# Patient Record
Sex: Male | Born: 2000 | Race: Black or African American | Hispanic: No | Marital: Single | Smoking: Current some day smoker
Health system: Southern US, Community
[De-identification: ages and names within clinical notes are randomized; demographics above are authoritative.]

---
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
## 2021-05-11 ENCOUNTER — Encounter (HOSPITAL_COMMUNITY): Payer: Self-pay | Admitting: Emergency Medicine

## 2021-05-11 ENCOUNTER — Emergency Department (HOSPITAL_COMMUNITY)
Admission: EM | Admit: 2021-05-11 | Discharge: 2021-05-12 | Disposition: A | Discharge status: Home / Self Care | Payer: Medicaid Other | Attending: Emergency Medicine | Admitting: Emergency Medicine

## 2021-05-11 ENCOUNTER — Other Ambulatory Visit: Payer: Self-pay

## 2021-05-11 DIAGNOSIS — Y92410 Unspecified street and highway as the place of occurrence of the external cause: Secondary | ICD-10-CM | POA: Diagnosis not present

## 2021-05-11 DIAGNOSIS — S161XXA Strain of muscle, fascia and tendon at neck level, initial encounter: Secondary | ICD-10-CM | POA: Insufficient documentation

## 2021-05-11 DIAGNOSIS — S299XXA Unspecified injury of thorax, initial encounter: Secondary | ICD-10-CM | POA: Insufficient documentation

## 2021-05-11 DIAGNOSIS — S199XXA Unspecified injury of neck, initial encounter: Secondary | ICD-10-CM | POA: Diagnosis present
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
## 2021-05-11 NOTE — ED Notes (Signed)
Pt is with child in peds ?

## 2021-05-12 ENCOUNTER — Emergency Department (HOSPITAL_COMMUNITY): Payer: Medicaid Other

## 2021-05-12 MED ORDER — TIZANIDINE HCL 4 MG PO TABS: 4.0000 mg | ORAL_TABLET | Freq: Four times a day (QID) | ORAL | 0 refills | Status: DC | PRN

## 2021-05-12 MED ORDER — IBUPROFEN 800 MG PO TABS
800.0000 mg | ORAL_TABLET | Freq: Once | ORAL | Status: AC
  Administered 2021-05-12: 800 mg via ORAL
  Filled 2021-05-12: qty 1
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
## 2021-05-12 NOTE — ED Notes (Signed)
Discharge instructions reviewed with patient. Patient verbalized understanding of instructions. Follow-up care and medications were reviewed. Patient ambulatory with steady gait. VSS upon discharge.  ?
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
## 2022-12-09 ENCOUNTER — Other Ambulatory Visit: Payer: Self-pay

## 2022-12-09 ENCOUNTER — Emergency Department (HOSPITAL_COMMUNITY)
Admission: EM | Admit: 2022-12-09 | Discharge: 2022-12-10 | Disposition: A | Discharge status: Home / Self Care | Payer: Worker's Compensation | Attending: Emergency Medicine | Admitting: Emergency Medicine

## 2022-12-09 DIAGNOSIS — Z23 Encounter for immunization: Secondary | ICD-10-CM | POA: Diagnosis not present

## 2022-12-09 DIAGNOSIS — Y990 Civilian activity done for income or pay: Secondary | ICD-10-CM | POA: Insufficient documentation

## 2022-12-09 DIAGNOSIS — T148XXA Other injury of unspecified body region, initial encounter: Secondary | ICD-10-CM

## 2022-12-09 DIAGNOSIS — S91232A Puncture wound without foreign body of left great toe with damage to nail, initial encounter: Secondary | ICD-10-CM | POA: Insufficient documentation

## 2022-12-09 DIAGNOSIS — S99922A Unspecified injury of left foot, initial encounter: Secondary | ICD-10-CM | POA: Diagnosis present

## 2022-12-09 NOTE — ED Triage Notes (Signed)
Pt to ED POV from work. 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 Pt states he was wearing his socks and shoes but nail went through both to his left foot. Pt states he is unsure of how deep the nail went into his foot, but did break the skin. Pt states he went to the bathroom at work and removed the nail himself, poured rubbing alcohol on foot, and then wrapped it with a bandage. Pt able to move foot / toes. No bleeding. Pt unsure of when his last tetanus shot was. Pt ambulatory.

## 2022-12-10 ENCOUNTER — Emergency Department (HOSPITAL_COMMUNITY): Payer: No Typology Code available for payment source

## 2022-12-10 MED ORDER — TETANUS-DIPHTH-ACELL PERTUSSIS 5-2.5-18.5 LF-MCG/0.5 IM SUSY
0.5000 mL | PREFILLED_SYRINGE | Freq: Once | INTRAMUSCULAR | Status: AC
  Administered 2022-12-10: 0.5 mL via INTRAMUSCULAR
  Filled 2022-12-10: qty 0.5

## 2022-12-10 MED ORDER — CIPROFLOXACIN HCL 500 MG PO TABS: 500.0000 mg | ORAL_TABLET | Freq: Two times a day (BID) | ORAL | 0 refills | Status: DC

## 2022-12-10 MED ORDER — CIPROFLOXACIN HCL 500 MG PO TABS
500.0000 mg | ORAL_TABLET | Freq: Once | ORAL | Status: AC
  Administered 2022-12-10: 500 mg via ORAL
  Filled 2022-12-10: qty 1

## 2022-12-10 NOTE — ED Provider Notes (Signed)
[place] EMERGENCY DEPARTMENT AT [facility] Provider Note   CSN: [id number] Arrival date & time: 12/09/22  1707     History  Chief Complaint  Patient presents with   Stepped on Nail    Brian Fox is a 22 y.o. male.  The history is provided by the patient and medical records.   22 year old male presenting to the ED for wound of left great toe.  He was at work and stepped on a rusty nail on the floor.  Nail went through his work boot, sock, and into the base of the left great toe.  He remove this on his own and did have some bleeding afterwards.  He is unsure of his last tetanus.  He denies any history of diabetes.  He did clean the area with alcohol and wrapped foot prior to arrival.  Home Medications Prior to Admission medications   Medication Sig Start Date End Date Taking? Authorizing Provider  ciprofloxacin (CIPRO) 500 MG tablet Take 1 tablet (500 mg total) by mouth every 12 (twelve) hours. 12/10/22  Yes Garlon Hatchet, PA-C  ibuprofen (ADVIL) 200 MG tablet Take 200 mg by mouth every 6 (six) hours as needed for headache or moderate pain.    [provider]  tiZANidine (ZANAFLEX) 4 MG tablet Take 1 tablet (4 mg total) by mouth every 6 (six) hours as needed for muscle spasms. 05/12/21   Tilden Fossa, MD      Allergies    Patient has no known allergies.    Review of Systems   Review of Systems  Skin:  Positive for wound.  All other systems reviewed and are negative.   Physical Exam Updated Vital Signs BP 124/80 (BP Location: Right Arm)   Pulse 60   Temp 98.2 F (36.8 C) (Oral)   Resp 16   SpO2 100%   Physical Exam Vitals and nursing note reviewed.  Constitutional:      Appearance: He is well-developed.  HENT:     Head: Normocephalic and atraumatic.  Eyes:     Conjunctiva/sclera: Conjunctivae normal.     Pupils: Pupils are equal, round, and reactive to light.  Cardiovascular:     Rate and Rhythm: Normal rate and regular rhythm.      Heart sounds: Normal heart sounds.  Pulmonary:     Effort: Pulmonary effort is normal.     Breath sounds: Normal breath sounds.  Abdominal:     General: Bowel sounds are normal.     Palpations: Abdomen is soft.  Musculoskeletal:        General: Normal range of motion.     Cervical back: Normal range of motion.     Comments: Puncture wound at base of left great toe, there is no active bleeding or drainage present, foot is dirty in appearance, multiple calluses noted  Skin:    General: Skin is warm and dry.  Neurological:     Mental Status: He is alert and oriented to person, place, and time.     ED Results / Procedures / Treatments   Labs (all labs ordered are listed, but only abnormal results are displayed) Labs Reviewed - No data to display  EKG None  Radiology DG Toe Great Left  Result Date: 12/10/2022 CLINICAL DATA:  Stepped on nail EXAM: LEFT GREAT TOE COMPARISON:  None Available. FINDINGS: There is no evidence of fracture or dislocation. There is no evidence of arthropathy or other focal bone abnormality. Soft tissues are unremarkable. IMPRESSION: Negative. Electronically  Signed   By: Jasmine Pang M.D.   On: 12/10/2022 02:32    Procedures Procedures    Medications Ordered in ED Medications  Tdap (BOOSTRIX) injection 0.5 mL (0.5 mLs Intramuscular Given 12/10/22 0127)  ciprofloxacin (CIPRO) tablet 500 mg (500 mg Oral Given 12/10/22 0127)    ED Course/ Medical Decision Making/ A&P                                 Medical Decision Making Amount and/or Complexity of Data Reviewed Radiology: ordered and independent interpretation performed.  Risk Prescription drug management.   22 y.o. M here with puncture wound to left great toe from rusty nail at work-- went through shoe, sock, and embedded in skin.  He removed this and cleansed area with alcohol PTA.  Small puncture wound at base of toe on my exam without bleeding or drainage.  No surrounding  erythema/induration on exam.  He is not diabetic.  Tetanus updated here.  X-ray without retrained FB.  Given abx for coverage of potential Pseudomonas given shoe involvement.  Encouraged to cleanse and dress daily.  Can follow-up with PCP.  Return here for any concerns.  Final Clinical Impression(s) / ED Diagnoses Final diagnoses:  Puncture wound    Rx / DC Orders ED Discharge Orders          Ordered    ciprofloxacin (CIPRO) 500 MG tablet  Every 12 hours        12/10/22 0251              Garlon Hatchet, PA-C 12/10/22 0257    Glynn Octave, MD 12/10/22 [phone]

## 2022-12-10 NOTE — ED Notes (Signed)
Pt arrived to [place] [facility] at this time A&O x4, NAD noted

## 2022-12-10 NOTE — Discharge Instructions (Signed)
X-ray without retained foreign body. Take the prescribed medication as directed.  Keep wound clean/dry. Follow-up with your doctor if any ongoing issues. Return to the ED for new or worsening symptoms.

## 2023-08-10 ENCOUNTER — Encounter (HOSPITAL_COMMUNITY): Payer: Self-pay

## 2023-08-10 ENCOUNTER — Ambulatory Visit (HOSPITAL_COMMUNITY)
Admission: EM | Admit: 2023-08-10 | Discharge: 2023-08-10 | Disposition: A | Discharge status: Home / Self Care | Payer: Self-pay | Attending: Internal Medicine | Admitting: Internal Medicine

## 2023-08-10 ENCOUNTER — Ambulatory Visit (INDEPENDENT_AMBULATORY_CARE_PROVIDER_SITE_OTHER): Payer: Self-pay

## 2023-08-10 DIAGNOSIS — Y9367 Activity, basketball: Secondary | ICD-10-CM

## 2023-08-10 DIAGNOSIS — S62354A Nondisplaced fracture of shaft of fourth metacarpal bone, right hand, initial encounter for closed fracture: Secondary | ICD-10-CM

## 2023-08-10 DIAGNOSIS — M79644 Pain in right finger(s): Secondary | ICD-10-CM

## 2023-08-10 MED ORDER — NAPROXEN 500 MG PO TABS: 500.0000 mg | ORAL_TABLET | Freq: Two times a day (BID) | ORAL | 0 refills | Status: DC

## 2023-08-10 NOTE — ED Provider Notes (Signed)
 [facility]    CSN: [id number] Arrival date & time: 08/10/23  1710      History   Chief Complaint Chief Complaint  Patient presents with   Finger Injury    HPI Brian Fox is a 23 y.o. male.   Brian Fox is a 23 y.o. male presenting for chief complaint of pain and swelling of the right hand that happened as a result of basketball injury 2 days ago.  Patient was catching the basketball when another player hit the basketball causing the right ring finger to hyper extend.  Patient states the finger was stuck in a hyperextended position after injury and he had to put the finger back into place with his other hand. When he returned the finger back to normal positioning, he felt a crack/pop to the right hand.  Denies previous injury to the right hand, numbness and tingling distally to injury, and laceration/abrasion.  He has taken ibuprofen  at home for the pain and swelling with some relief.     History reviewed. No pertinent past medical history.  There are no active problems to display for this patient.   History reviewed. No pertinent surgical history.     Home Medications    Prior to Admission medications   Medication Sig Start Date End Date Taking? Authorizing Provider  naproxen (NAPROSYN) 500 MG tablet Take 1 tablet (500 mg total) by mouth 2 (two) times daily. 08/10/23  Yes Enedelia Dorna HERO, FNP  ibuprofen  (ADVIL ) 200 MG tablet Take 200 mg by mouth every 6 (six) hours as needed for headache or moderate pain.    [provider]  tiZANidine  (ZANAFLEX ) 4 MG tablet Take 1 tablet (4 mg total) by mouth every 6 (six) hours as needed for muscle spasms. Patient not taking: Reported on 08/10/2023 05/12/21   Griselda Norris, MD    Family History History reviewed. No pertinent family history.  Social History Social History   Tobacco Use   Smoking status: Some Days    Types: Cigars   Smokeless tobacco: Never  Vaping Use   Vaping status: Never  Used  Substance Use Topics   Alcohol use: No   Drug use: No     Allergies   Patient has no known allergies.   Review of Systems Review of Systems Per HPI  Physical Exam Triage Vital Signs ED Triage Vitals  Encounter Vitals Group     BP 08/10/23 1800 129/66     Girls Systolic BP Percentile --      Girls Diastolic BP Percentile --      Boys Systolic BP Percentile --      Boys Diastolic BP Percentile --      Pulse Rate 08/10/23 1800 78     Resp 08/10/23 1800 16     Temp 08/10/23 1800 98.4 F (36.9 C)     Temp Source 08/10/23 1800 Oral     SpO2 08/10/23 1800 97 %     Weight --      Height --      Head Circumference --      Peak Flow --      Pain Score 08/10/23 1801 6     Pain Loc --      Pain Education --      Exclude from Growth Chart --    No data found.  Updated Vital Signs BP 129/66 (BP Location: Left Arm)   Pulse 78   Temp 98.4 F (36.9 C) (Oral)   Resp 16  SpO2 97%   Visual Acuity Right Eye Distance:   Left Eye Distance:   Bilateral Distance:    Right Eye Near:   Left Eye Near:    Bilateral Near:     Physical Exam Vitals and nursing note reviewed.  Constitutional:      Appearance: He is not ill-appearing or toxic-appearing.  HENT:     Head: Normocephalic and atraumatic.     Right Ear: Hearing and external ear normal.     Left Ear: Hearing and external ear normal.     Nose: Nose normal.     Mouth/Throat:     Lips: Pink.   Eyes:     General: Lids are normal. Vision grossly intact. Gaze aligned appropriately.     Extraocular Movements: Extraocular movements intact.     Conjunctiva/sclera: Conjunctivae normal.   Pulmonary:     Effort: Pulmonary effort is normal.   Musculoskeletal:     Right wrist: Normal.     Left wrist: Normal.     Right hand: Swelling and tenderness present. No deformity, lacerations or bony tenderness. Normal range of motion. Normal strength. Normal sensation. There is no disruption of two-point discrimination. Normal  capillary refill. Normal pulse.     Left hand: Normal.     Cervical back: Neck supple.     Comments: 5/5 grip strength of right hand.  +2 right radial pulse.  Less than 2 cap refill.  Sensation and strength intact to distal right hand.  Tender to palpation over the right fourth metacarpal, swelling over area of tenderness as well.   Skin:    General: Skin is warm and dry.     Capillary Refill: Capillary refill takes less than 2 seconds.     Findings: No rash.   Neurological:     General: No focal deficit present.     Mental Status: He is alert and oriented to person, place, and time. Mental status is at baseline.     Cranial Nerves: No dysarthria or facial asymmetry.   Psychiatric:        Mood and Affect: Mood normal.        Speech: Speech normal.        Behavior: Behavior normal.        Thought Content: Thought content normal.        Judgment: Judgment normal.      UC Treatments / Results  Labs (all labs ordered are listed, but only abnormal results are displayed) Labs Reviewed - No data to display  EKG   Radiology DG Hand Complete Right Result Date: 08/10/2023 CLINICAL DATA:  [facility]jury to right ring finger 2 days ago while playing basketball (hyperextended right ring finger) EXAM: RIGHT HAND - COMPLETE 3+ VIEW COMPARISON:  None Available. FINDINGS: There is oblique / spiral fracture through the diaphysis of the 4 metacarpal. The fracture is nondisplaced. No dislocation. IMPRESSION: Nondisplaced spiral type fracture of the fourth metacarpal. Electronically Signed   By: Jackquline Boxer M.D.   On: 08/10/2023 18:47    Procedures Procedures (including critical care time)  Medications Ordered in UC Medications - No data to display  Initial Impression / Assessment and Plan / UC Course  I have reviewed the triage vital signs and the nursing notes.  Pertinent labs & imaging results that were available during my care of the patient were reviewed by me and considered in my  medical decision making (see chart for details).   1.  Closed nondisplaced fracture of shaft of fourth  metacarpal bone of right hand, injury while playing basketball X-ray shows nondisplaced spiral fracture of the right fourth metacarpal.   Ulnar gutter splint placed by Ortho tech, good cap refill distally post splint placement.  Discussed splint care (avoid getting wet, etc), patient to keep splint on until ortho follow-up. RICE advised. Information for orthopedic follow-up given, advised to schedule appointment for the next 3-5 days for follow-up.   Naproxen every 12 hours as needed for pain and swelling.  Counseled patient on potential for adverse effects with medications prescribed/recommended today, strict ER and return-to-clinic precautions discussed, patient verbalized understanding.    Final Clinical Impressions(s) / UC Diagnoses   Final diagnoses:  Closed nondisplaced fracture of shaft of fourth metacarpal bone of right hand, initial encounter  Injury while playing basketball     Discharge Instructions      You have fractured your hand. We placed your hand in a splint, avoid getting the splint wet. Wear splint at all times and do not remove it until your orthopedic follow-up appointment.  Rest, ice, elevate, and compress the injury to reduce swelling and inflammation.   Naproxen every 12 hours as needed for pain and swelling.  Schedule an appointment with the orthopedic provider listed on your paperwork for follow-up in the next 3-5 days.  Return if you experience worsening pain, numbness, tingling, skin color changes, or any other concerning symptoms. If symptoms are severe, please go to the ER. I hope you feel better!!      ED Prescriptions     Medication Sig Dispense Auth. Provider   naproxen (NAPROSYN) 500 MG tablet Take 1 tablet (500 mg total) by mouth 2 (two) times daily. 30 tablet Enedelia Dorna HERO, FNP      PDMP not reviewed this encounter.    Enedelia Dorna HERO, [place] 08/10/23 1925

## 2023-08-10 NOTE — ED Notes (Signed)
Ortho notified

## 2023-08-10 NOTE — Discharge Instructions (Addendum)
 You have fractured your hand. We placed your hand in a splint, avoid getting the splint wet. Wear splint at all times and do not remove it until your orthopedic follow-up appointment.  Rest, ice, elevate, and compress the injury to reduce swelling and inflammation.   Naproxen every 12 hours as needed for pain and swelling.  Schedule an appointment with the orthopedic provider listed on your paperwork for follow-up in the next 3-5 days.  Return if you experience worsening pain, numbness, tingling, skin color changes, or any other concerning symptoms. If symptoms are severe, please go to the ER. I hope you feel better!!

## 2023-08-10 NOTE — ED Triage Notes (Signed)
 Pt states playing basketball on Saturday and bent his rt ring finger back. C/o pain. States took ibuprofen  and used ice with no relief.

## 2023-08-18 ENCOUNTER — Encounter: Payer: Self-pay | Admitting: Physician Assistant

## 2023-08-18 ENCOUNTER — Other Ambulatory Visit (INDEPENDENT_AMBULATORY_CARE_PROVIDER_SITE_OTHER): Payer: Self-pay

## 2023-08-18 ENCOUNTER — Ambulatory Visit (INDEPENDENT_AMBULATORY_CARE_PROVIDER_SITE_OTHER): Payer: Self-pay | Admitting: Physician Assistant

## 2023-08-18 DIAGNOSIS — S62344A Nondisplaced fracture of base of fourth metacarpal bone, right hand, initial encounter for closed fracture: Secondary | ICD-10-CM | POA: Insufficient documentation

## 2023-08-18 DIAGNOSIS — M79644 Pain in right finger(s): Secondary | ICD-10-CM

## 2023-08-18 NOTE — Progress Notes (Signed)
   Office Visit Note   Patient: Brian Fox           Date of Birth: 2000-10-31           MRN: [id number] Visit Date: 08/18/2023              Requested by: No referring provider defined for this encounter. PCP: Pcp, No   Assessment & Plan: Visit Diagnoses:  1. Pain in finger of right hand   2. Nondisplaced fracture of base of fourth metacarpal bone, right hand, initial encounter for closed fracture     Plan: Patient is a pleasant healthy 23 year old gentleman who is about a week status post fracture to his right ring finger when playing basketball.  He said he felt a crack when the ball was thrown out of him.  He was seen in emergency room demonstrating a nondisplaced shaft fracture of the fourth metacarpal.  He has been in an ulnar gutter splint.  He is right-hand dominant.  X-rays today fracture has not displaced.  Also has no deviation when flexing his finger and no deviation when straightening his finger.  Follow-Up Instructions: Return in about 2 weeks (around 09/01/2023).   Orders:  Orders Placed This Encounter  Procedures   XR Finger Ring Right   No orders of the defined types were placed in this encounter.     Procedures: No procedures performed   Clinical Data: No additional findings.   Subjective: No chief complaint on file.   HPI pleasant 23 year old right-hand-dominant gentleman comes in today with a 8-day history of right hand pain after playing basketball and having the basketball hit and twisted his finger.  Was seen in a urgent care told he had a fracture is in an ulnar gutter splint  Review of Systems  All other systems reviewed and are negative.    Objective: Vital Signs: There were no vitals taken for this visit.  Physical Exam Constitutional:      Appearance: Normal appearance.  Pulmonary:     Effort: Pulmonary effort is normal.  Skin:    General: Skin is warm and dry.  Neurological:     General: No focal deficit present.     Mental  Status: He is alert and oriented to person, place, and time.  Psychiatric:        Mood and Affect: Mood normal.        Behavior: Behavior normal.     Ortho Exam Right hand he has no abrasions mild soft tissue swelling tender over the fourth metacarpal shaft.  He has no deviation of the dorsal surface of the finger with laying his hand flat.  No deviation with flexion.  Brisk capillary refill.  Pulses intact Specialty Comments:  No specialty comments available.  Imaging: No results found.   PMFS History: Patient Active Problem List   Diagnosis Date Noted   Nondisplaced fracture of base of fourth metacarpal bone, right hand, initial encounter for closed fracture 08/18/2023   History reviewed. No pertinent past medical history.  History reviewed. No pertinent family history.  History reviewed. No pertinent surgical history. Social History   Occupational History   Not on file  Tobacco Use   Smoking status: Some Days    Types: Cigars   Smokeless tobacco: Never  Vaping Use   Vaping status: Never Used  Substance and Sexual Activity   Alcohol use: No   Drug use: No   Sexual activity: Not on file

## 2023-09-01 ENCOUNTER — Ambulatory Visit (INDEPENDENT_AMBULATORY_CARE_PROVIDER_SITE_OTHER): Payer: Self-pay | Admitting: Physician Assistant

## 2023-09-01 ENCOUNTER — Other Ambulatory Visit (INDEPENDENT_AMBULATORY_CARE_PROVIDER_SITE_OTHER): Payer: Self-pay

## 2023-09-01 DIAGNOSIS — M79644 Pain in right finger(s): Secondary | ICD-10-CM

## 2023-09-01 DIAGNOSIS — S62344A Nondisplaced fracture of base of fourth metacarpal bone, right hand, initial encounter for closed fracture: Secondary | ICD-10-CM

## 2023-09-01 NOTE — Progress Notes (Signed)
   Office Visit Note   Patient: Brian Fox           Date of Birth: March 23, 2000           MRN: [id number] Visit Date: 09/01/2023              Requested by: No referring provider defined for this encounter. PCP: Pcp, No   Assessment & Plan: Visit Diagnoses:  1. Pain in finger of right hand   2. Nondisplaced fracture of base of fourth metacarpal bone, right hand, initial encounter for closed fracture     Plan: Impression is 3 weeks status post right hand fourth metacarpal shaft fracture.  He is clinically improving.  Radiographically he is stable.  At this point, we will have him wear his Velcro splint for another week and then transition out of this.  Will go ahead and put an order for occupational therapy where he can start when he is 4 weeks out from injury.  Follow-up with us  in 3 weeks for repeat evaluation and three-view x-rays of the right hand.  Call with concerns or questions.  Follow-Up Instructions: Return in about 3 weeks (around 09/22/2023).   Orders:  Orders Placed This Encounter  Procedures   XR Hand Complete Right   No orders of the defined types were placed in this encounter.     Procedures: No procedures performed   Clinical Data: No additional findings.   Subjective: Chief Complaint  Patient presents with   Right Hand - Pain    HPI patient is a pleasant 23 year old right-hand-dominant gentleman who comes in today approximately 3 weeks status post right hand fourth metacarpal fracture.  He was seen by [facility] a couple weeks ago at his initial visit for this.  He is here today for follow-up.  He is doing better.  He has minimal pain which is alleviated with naproxen .  He has been compliant wearing his Velcro ulnar gutter splint.  Review of Systems as detailed in HPI.  All others reviewed and are negative.   Objective: Vital Signs: There were no vitals taken for this visit.  Physical Exam well-developed well-nourished gentleman in no acute  distress.  Alert and oriented x 3.  Ortho Exam right hand exam: No swelling or ecchymosis.  He has mild tenderness to the fourth metacarpal shaft just over the fracture site.  He is able to make a full composite fist.  He has no rotational deformity.  He is neurovascularly intact distally.  Specialty Comments:  No specialty comments available.  Imaging: XR Hand Complete Right Result Date: 09/01/2023 X-rays demonstrate stable alignment of the fourth metacarpal shaft fracture.    PMFS History: Patient Active Problem List   Diagnosis Date Noted   Nondisplaced fracture of base of fourth metacarpal bone, right hand, initial encounter for closed fracture 08/18/2023   No past medical history on file.  No family history on file.  No past surgical history on file. Social History   Occupational History   Not on file  Tobacco Use   Smoking status: Some Days    Types: Cigars   Smokeless tobacco: Never  Vaping Use   Vaping status: Never Used  Substance and Sexual Activity   Alcohol use: No   Drug use: No   Sexual activity: Not on file

## 2023-09-13 ENCOUNTER — Ambulatory Visit
Admission: EM | Admit: 2023-09-13 | Discharge: 2023-09-13 | Disposition: A | Discharge status: Home / Self Care | Payer: Self-pay | Attending: Family Medicine | Admitting: Family Medicine

## 2023-09-13 DIAGNOSIS — N342 Other urethritis: Secondary | ICD-10-CM | POA: Insufficient documentation

## 2023-09-13 DIAGNOSIS — Z7251 High risk heterosexual behavior: Secondary | ICD-10-CM | POA: Insufficient documentation

## 2023-09-13 NOTE — ED Triage Notes (Signed)
 Pt c/o tingling to my penis and dysuria x 2 days-NAD-steady gait

## 2023-09-13 NOTE — ED Provider Notes (Signed)
  [facility]  Note:  This document was prepared using Conservation officer, historic buildings and may include unintentional dictation errors.  MRN: [id number] DOB: 2001-01-09  Subjective:   Brian Fox is a 23 y.o. male presenting for 2-day history of persistent intermittent dysuria, tingling of the penis.  Patient would like to have an STI check as he had unprotected sex 2 weeks ago.  Tries to hydrate with water but also drinks urinary irritants regularly including fruit juice, soda, tea. Denies hematuria, urinary frequency, penile discharge, penile swelling, testicular pain, testicular swelling, anal pain, groin pain, genital rashes.   He is not currently taking any medications and has no known food or drug allergies.  Denies past medical and surgical history.   No family history on file.  Social History   Tobacco Use   Smoking status: Some Days    Types: Cigars, Cigarettes   Smokeless tobacco: Never  Vaping Use   Vaping status: Never Used  Substance Use Topics   Alcohol use: Yes    Comment: occ   Drug use: Yes    Types: Marijuana    ROS   Objective:   Vitals: BP 122/85 (BP Location: Left Arm)   Pulse 71   Temp 98.4 F (36.9 C) (Oral)   Resp 16   SpO2 98%   Physical Exam Constitutional:      General: He is not in acute distress.    Appearance: Normal appearance. He is well-developed and normal weight. He is not ill-appearing, toxic-appearing or diaphoretic.  HENT:     Head: Normocephalic and atraumatic.     Right Ear: External ear normal.     Left Ear: External ear normal.     Nose: Nose normal.     Mouth/Throat:     Pharynx: Oropharynx is clear.  Eyes:     General: No scleral icterus.       Right eye: No discharge.        Left eye: No discharge.     Extraocular Movements: Extraocular movements intact.  Cardiovascular:     Rate and Rhythm: Normal rate.  Pulmonary:     Effort: Pulmonary effort is normal.  Musculoskeletal:      Cervical back: Normal range of motion.  Neurological:     Mental Status: He is alert and oriented to person, place, and time.  Psychiatric:        Mood and Affect: Mood normal.        Behavior: Behavior normal.        Thought Content: Thought content normal.        Judgment: Judgment normal.     Assessment and Plan :   PDMP not reviewed this encounter.  1. Urethritis   2. Unprotected sex    Declined blood work for HIV and syphilis.  STI check pending.  Emphasized need to hydrate much better and avoid urinary irritants.  Will treat as appropriate based off the lab results.   Christopher Savannah, [place] 09/13/23 [id number]

## 2023-09-13 NOTE — Discharge Instructions (Signed)
 Make sure you hydrate very well with plain water and a quantity of 80 ounces of water a day.  Please limit drinks that are considered urinary irritants such as soda, sweet tea, coffee, energy drinks, alcohol.  These can worsen your urinary and genital symptoms but also be the source of them.  I will let you know about your penis swab results through MyChart to see if we need to prescribe or change your antibiotics based off of those results.

## 2023-09-14 ENCOUNTER — Ambulatory Visit (HOSPITAL_COMMUNITY): Payer: Self-pay

## 2023-09-14 LAB — CYTOLOGY, (ORAL, ANAL, URETHRAL) ANCILLARY ONLY
Chlamydia: POSITIVE — AB
Comment: NEGATIVE
Comment: NEGATIVE
Comment: NORMAL
Neisseria Gonorrhea: POSITIVE — AB
Trichomonas: NEGATIVE

## 2023-09-14 MED ORDER — DOXYCYCLINE HYCLATE 100 MG PO TABS: 100.0000 mg | ORAL_TABLET | Freq: Two times a day (BID) | ORAL | 0 refills | Status: AC

## 2023-09-15 ENCOUNTER — Ambulatory Visit
Admission: RE | Admit: 2023-09-15 | Discharge: 2023-09-15 | Disposition: A | Discharge status: Home / Self Care | Payer: Self-pay | Source: Ambulatory Visit | Attending: Nurse Practitioner | Admitting: Nurse Practitioner

## 2023-09-15 DIAGNOSIS — A549 Gonococcal infection, unspecified: Secondary | ICD-10-CM

## 2023-09-15 DIAGNOSIS — A749 Chlamydial infection, unspecified: Secondary | ICD-10-CM

## 2023-09-15 MED ORDER — CEFTRIAXONE SODIUM 500 MG IJ SOLR
500.0000 mg | INTRAMUSCULAR | Status: DC
  Administered 2023-09-15: 500 mg via INTRAMUSCULAR

## 2023-09-15 NOTE — ED Triage Notes (Signed)
Pt here to get tx for gonorrhea. ?

## 2023-09-22 ENCOUNTER — Other Ambulatory Visit: Payer: Self-pay

## 2023-09-22 ENCOUNTER — Ambulatory Visit: Payer: Self-pay | Admitting: Physician Assistant

## 2023-09-22 ENCOUNTER — Encounter: Payer: Self-pay | Admitting: Physician Assistant

## 2023-09-22 DIAGNOSIS — S62344A Nondisplaced fracture of base of fourth metacarpal bone, right hand, initial encounter for closed fracture: Secondary | ICD-10-CM

## 2023-09-22 NOTE — Progress Notes (Signed)
   Office Visit Note   Patient: Brian Fox           Date of Birth: 09/23/2000           MRN: [id number] Visit Date: 09/22/2023              Requested by: No referring provider defined for this encounter. PCP: Pcp, No   Assessment & Plan: Visit Diagnoses:  1. Nondisplaced fracture of base of fourth metacarpal bone, right hand, initial encounter for closed fracture     Plan: Impression 6 weeks status post right hand fourth metacarpal shaft fracture.  Clinically, the patient has improved quite a bit.  He is only having stiffness with the extremes of wrist extension.  Radiographically, he does have evidence of callus formation.  Although he does not feel as though he needs occupational therapy for his fracture, he has expressed interest for his wrist due to the stiffness.  I sent in a new referral for this.  He will follow-up with us  in 4 weeks for repeat evaluation and x-rays of the right hand.  Advance with activity as symptoms allow.  Call with concerns or questions.  Follow-Up Instructions: Return in about 4 weeks (around 10/20/2023).   Orders:  Orders Placed This Encounter  Procedures   XR Hand Complete Right   Ambulatory referral to Occupational Therapy   No orders of the defined types were placed in this encounter.     Procedures: No procedures performed   Clinical Data: No additional findings.   Subjective: Chief Complaint  Patient presents with   Right Hand - Follow-up    4th metacarpal fracture    HPI patient is a pleasant 23 year old who comes in today 6 weeks status post fourth metacarpal shaft fracture.  He stopped wearing his ulnar gutter splint about a day or 2 ago.  He has not been to outpatient physical therapy.  He feels his hand is fine but does have some stiffness with wrist extension.   Review of Systems as detailed in HPI.  All others reviewed and are negative.   Objective: Vital Signs: There were no vitals taken for this visit.  Physical  Exam well-developed well-nourished gentleman in no acute distress.  Alert and oriented x 3.  Ortho Exam right hand exam: No tenderness to the fracture site or wrist.  Painless range of motion of all 5 fingers.  He does have increased pain with the extremes of wrist extension.  He is neurovascularly intact distally.  Specialty Comments:  No specialty comments available.  Imaging: XR Hand Complete Right Result Date: 09/22/2023 X-rays demonstrate callus formation around the fracture site    PMFS History: Patient Active Problem List   Diagnosis Date Noted   Nondisplaced fracture of base of fourth metacarpal bone, right hand, initial encounter for closed fracture 08/18/2023   History reviewed. No pertinent past medical history.  No family history on file.  History reviewed. No pertinent surgical history. Social History   Occupational History   Not on file  Tobacco Use   Smoking status: Some Days    Types: Cigars, Cigarettes   Smokeless tobacco: Never  Vaping Use   Vaping status: Never Used  Substance and Sexual Activity   Alcohol use: Yes    Comment: occ   Drug use: Yes    Types: Marijuana   Sexual activity: Not on file

## 2023-10-20 ENCOUNTER — Ambulatory Visit: Payer: Self-pay | Admitting: Physician Assistant
